# Patient Record
Sex: Female | Born: 1985 | Race: Black or African American | Hispanic: No | Marital: Single | Smoking: Current every day smoker
Health system: Southern US, Community
[De-identification: ages and names within clinical notes are randomized; demographics above are authoritative.]

---

## 2013-09-04 ENCOUNTER — Encounter (HOSPITAL_BASED_OUTPATIENT_CLINIC_OR_DEPARTMENT_OTHER): Payer: Self-pay | Admitting: Emergency Medicine

## 2013-09-04 ENCOUNTER — Emergency Department (HOSPITAL_BASED_OUTPATIENT_CLINIC_OR_DEPARTMENT_OTHER)
Admission: EM | Admit: 2013-09-04 | Discharge: 2013-09-04 | Disposition: A | Discharge status: Home / Self Care | Payer: Self-pay | Attending: Emergency Medicine | Admitting: Emergency Medicine

## 2013-09-04 DIAGNOSIS — Y9389 Activity, other specified: Secondary | ICD-10-CM | POA: Insufficient documentation

## 2013-09-04 DIAGNOSIS — Y929 Unspecified place or not applicable: Secondary | ICD-10-CM | POA: Insufficient documentation

## 2013-09-04 DIAGNOSIS — F17200 Nicotine dependence, unspecified, uncomplicated: Secondary | ICD-10-CM | POA: Insufficient documentation

## 2013-09-04 DIAGNOSIS — S0501XA Injury of conjunctiva and corneal abrasion without foreign body, right eye, initial encounter: Secondary | ICD-10-CM

## 2013-09-04 DIAGNOSIS — S058X9A Other injuries of unspecified eye and orbit, initial encounter: Secondary | ICD-10-CM | POA: Insufficient documentation

## 2013-09-04 DIAGNOSIS — X58XXXA Exposure to other specified factors, initial encounter: Secondary | ICD-10-CM | POA: Insufficient documentation

## 2013-09-04 MED ORDER — CYCLOPENTOLATE HCL 1 % OP SOLN
1.0000 [drp] | Freq: Once | OPHTHALMIC | Status: AC
  Administered 2013-09-04: 1 [drp] via OPHTHALMIC
  Filled 2013-09-04: qty 2

## 2013-09-04 MED ORDER — TETRACAINE HCL 0.5 % OP SOLN: 1.0000 [drp] | Freq: Once | OPHTHALMIC | Status: DC

## 2013-09-04 MED ORDER — HYDROCODONE-ACETAMINOPHEN 5-325 MG PO TABS: 2.0000 | ORAL_TABLET | ORAL | Status: AC | PRN

## 2013-09-04 MED ORDER — TETRACAINE HCL 0.5 % OP SOLN
1.0000 [drp] | Freq: Once | OPHTHALMIC | Status: AC
  Administered 2013-09-04: 1 [drp] via OPHTHALMIC
  Filled 2013-09-04: qty 2

## 2013-09-04 MED ORDER — FLUORESCEIN SODIUM 1 MG OP STRP
1.0000 | ORAL_STRIP | Freq: Once | OPHTHALMIC | Status: AC
  Administered 2013-09-04: 1 via OPHTHALMIC
  Filled 2013-09-04: qty 1

## 2013-09-04 MED ORDER — TOBRAMYCIN 0.3 % OP SOLN: 2.0000 [drp] | OPHTHALMIC | Status: AC

## 2013-09-04 NOTE — ED Notes (Signed)
Pt reports eye pain that started last night after taking out her contacts.  Noted to have a swollen, red (R) eye.  Reports drainage from it.

## 2013-09-04 NOTE — ED Provider Notes (Signed)
CSN: [id number]     Arrival date & time 09/04/13  0907 History   First MD Initiated Contact with Patient 09/04/13 [phone]     Chief Complaint  Patient presents with  . Eye Injury     (Consider location/radiation/quality/duration/timing/severity/associated sxs/prior Treatment) Patient is a 28 y.o. female presenting with eye injury. The history is provided by the patient. No language interpreter was used.  Eye Injury This is a new problem. The current episode started yesterday. The problem occurs constantly. The problem has been gradually worsening. Nothing aggravates the symptoms. She has tried nothing for the symptoms. The treatment provided mild relief.    History reviewed. No pertinent past medical history. History reviewed. No pertinent past surgical history. History reviewed. No pertinent family history. History  Substance Use Topics  . Smoking status: Current Every Day Smoker -- 1.00 packs/day    Types: Cigarettes  . Smokeless tobacco: Not on file  . Alcohol Use: No   OB History   Grav Para Term Preterm Abortions TAB SAB Ect Mult Living                 Review of Systems  Eyes: Positive for redness and visual disturbance.  All other systems reviewed and are negative.      Allergies  Review of patient's allergies indicates no known allergies.  Home Medications  No current outpatient prescriptions on file. BP 143/89  Pulse 99  Temp(Src) 98.2 F (36.8 C) (Oral)  Resp 18  SpO2 98% Physical Exam  Nursing note and vitals reviewed. Constitutional: She appears well-nourished.  HENT:  Head: Normocephalic and atraumatic.  Eyes: EOM are normal. Pupils are equal, round, and reactive to light. Right eye exhibits exudate. Right conjunctiva is injected.  Slit lamp exam:      The right eye shows fluorescein uptake.  wrythema,  Neck: Normal range of motion.  Cardiovascular: Normal rate.   Pulmonary/Chest: Effort normal.    ED Course  Procedures (including critical care  time) Labs Review Labs Reviewed - No data to display Imaging Review No results found.   EKG Interpretation None      MDM   Final diagnoses:  Corneal abrasion, right   No current eye doctor.   Pt advised to return for recheck     Elson AreasLeslie K Sofia, [place] JerseyPA-C 09/04/13 [phone]

## 2013-09-04 NOTE — Discharge Instructions (Signed)
Corneal Abrasion  The cornea is the clear covering at the front and center of the eye. When looking at the colored portion of the eye (iris), you are looking through the cornea. This very thin tissue is made up of many layers. The surface layer is a single layer of cells (corneal epithelium) and is one of the most sensitive tissues in the body. If a scratch or injury causes the corneal epithelium to come off, it is called a corneal abrasion. If the injury extends to the tissues below the epithelium, the condition is called a corneal ulcer.  CAUSES    Scratches.   Trauma.   Foreign body in the eye.  Some people have recurrences of abrasions in the area of the original injury even after it has healed (recurrent erosion syndrome). Recurrent erosion syndrome generally improves and goes away with time.  SYMPTOMS    Eye pain.   Difficulty or inability to keep the injured eye open.   The eye becomes very sensitive to light.   Recurrent erosions tend to happen suddenly, first thing in the morning, usually after waking up and opening the eye.  DIAGNOSIS   Your health care provider can diagnose a corneal abrasion during an eye exam. Dye is usually placed in the eye using a drop or a small paper strip moistened by your tears. When the eye is examined with a special light, the abrasion shows up clearly because of the dye.  TREATMENT    Small abrasions may be treated with antibiotic drops or ointment alone.   Usually a pressure patch is specially applied. Pressure patches prevent the eye from blinking, allowing the corneal epithelium to heal. A pressure patch also reduces the amount of pain present in the eye during healing. Most corneal abrasions heal within 2 3 days with no effect on vision.  If the abrasion becomes infected and spreads to the deeper tissues of the cornea, a corneal ulcer can result. This is serious because it can cause corneal scarring. Corneal scars interfere with light passing through the cornea  and cause a loss of vision in the involved eye.  HOME CARE INSTRUCTIONS   Use medicine or ointment as directed. Only take over-the-counter or prescription medicines for pain, discomfort, or fever as directed by your health care provider.   Do not drive or operate machinery while your eye is patched. Your ability to judge distances is impaired.   If your health care provider has given you a follow-up appointment, it is very important to keep that appointment. Not keeping the appointment could result in a severe eye infection or permanent loss of vision. If there is any problem keeping the appointment, let your health care provider know.  SEEK MEDICAL CARE IF:    You have pain, light sensitivity, and a scratchy feeling in one eye or both eyes.   Your pressure patch keeps loosening up, and you can blink your eye under the patch after treatment.   Any kind of discharge develops from the eye after treatment or if the lids stick together in the morning.   You have the same symptoms in the morning as you did with the original abrasion days, weeks, or months after the abrasion healed.  MAKE SURE YOU:    Understand these instructions.   Will watch your condition.   Will get help right away if you are not doing well or get worse.  Document Released: 06/12/2000 Document Revised: 04/05/2013 Document Reviewed: 02/20/2013  [employer] Patient Information   2014 [employer]

## 2013-09-04 NOTE — ED Provider Notes (Signed)
Medical screening examination/treatment/procedure(s) were performed by non-physician practitioner and as supervising physician I was immediately available for consultation/collaboration.   EKG Interpretation None        Audree CamelScott T Devansh Riese, MD 09/04/13 [phone]

## 2019-10-23 ENCOUNTER — Other Ambulatory Visit: Payer: Self-pay

## 2019-10-23 ENCOUNTER — Encounter (HOSPITAL_BASED_OUTPATIENT_CLINIC_OR_DEPARTMENT_OTHER): Payer: Self-pay | Admitting: *Deleted

## 2019-10-23 ENCOUNTER — Emergency Department (HOSPITAL_BASED_OUTPATIENT_CLINIC_OR_DEPARTMENT_OTHER): Payer: Medicaid Other

## 2019-10-23 ENCOUNTER — Emergency Department (HOSPITAL_BASED_OUTPATIENT_CLINIC_OR_DEPARTMENT_OTHER)
Admission: EM | Admit: 2019-10-23 | Discharge: 2019-10-23 | Disposition: A | Discharge status: Home / Self Care | Payer: Medicaid Other | Attending: Emergency Medicine | Admitting: Emergency Medicine

## 2019-10-23 DIAGNOSIS — F17210 Nicotine dependence, cigarettes, uncomplicated: Secondary | ICD-10-CM | POA: Diagnosis not present

## 2019-10-23 DIAGNOSIS — M545 Low back pain: Secondary | ICD-10-CM | POA: Diagnosis not present

## 2019-10-23 DIAGNOSIS — M25511 Pain in right shoulder: Secondary | ICD-10-CM | POA: Diagnosis present

## 2019-10-23 DIAGNOSIS — M7918 Myalgia, other site: Secondary | ICD-10-CM

## 2019-10-23 LAB — PREGNANCY, URINE: Preg Test, Ur: NEGATIVE

## 2019-10-23 MED ORDER — HYDROCODONE-ACETAMINOPHEN 5-325 MG PO TABS
1.0000 | ORAL_TABLET | Freq: Once | ORAL | Status: AC
  Administered 2019-10-23: 1 via ORAL
  Filled 2019-10-23: qty 1

## 2019-10-23 MED ORDER — NAPROXEN 250 MG PO TABS
500.0000 mg | ORAL_TABLET | Freq: Once | ORAL | Status: AC
  Administered 2019-10-23: 500 mg via ORAL
  Filled 2019-10-23: qty 2

## 2019-10-23 MED ORDER — NAPROXEN 500 MG PO TABS: 500.0000 mg | ORAL_TABLET | Freq: Two times a day (BID) | ORAL | 0 refills | Status: AC

## 2019-10-23 MED ORDER — CYCLOBENZAPRINE HCL 10 MG PO TABS: 10.0000 mg | ORAL_TABLET | Freq: Two times a day (BID) | ORAL | 0 refills | Status: AC | PRN

## 2019-10-23 NOTE — Discharge Instructions (Signed)
You were seen today for shoulder and back pain.  Your x-rays are negative for fracture.  This is likely muscle pain related to your injury.  You will likely be very sore in the next 2 to 3 days.  Take medications as prescribed.
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
## 2021-11-13 IMAGING — DX DG LUMBAR SPINE COMPLETE 4+V
5 series · 5 of 5 positions shown · non-contrast
Comparison: None.

CLINICAL DATA: Pain and trauma

EXAM:
LUMBAR SPINE - COMPLETE 4+ VIEW

[l-spine ap]
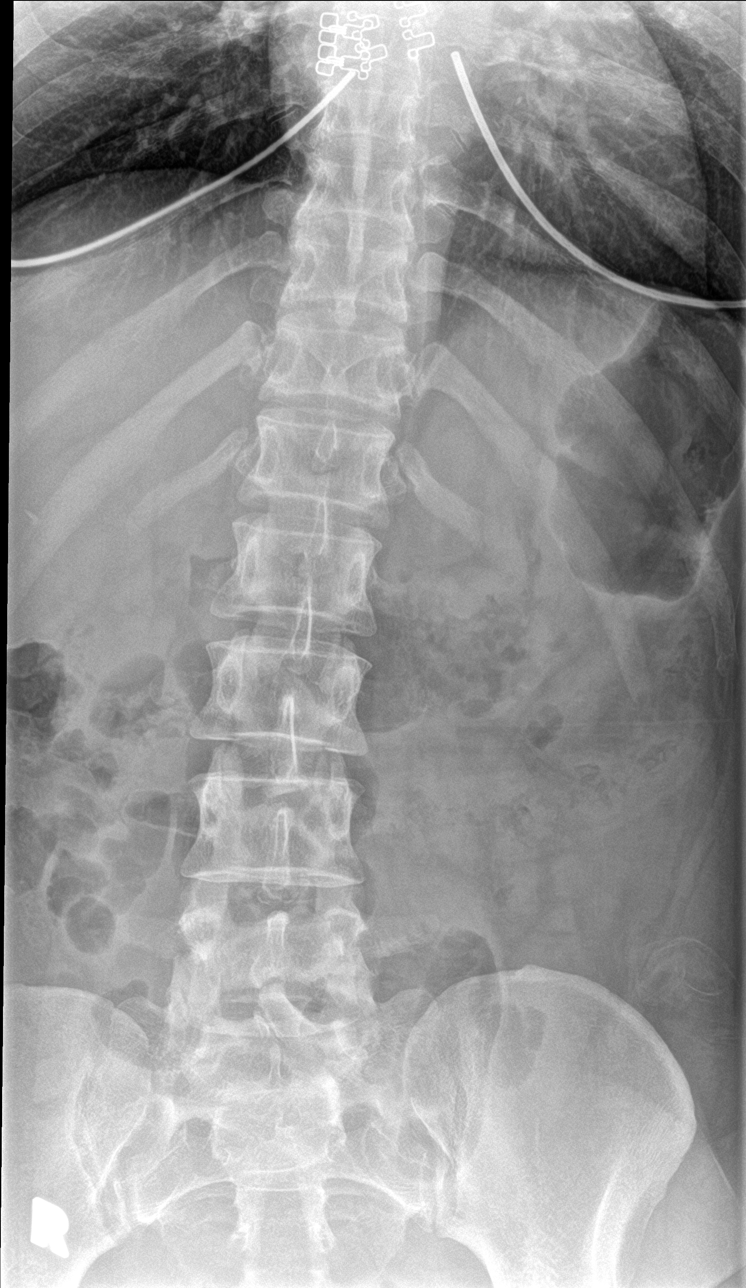

[l-spine obl (1 of 2)]
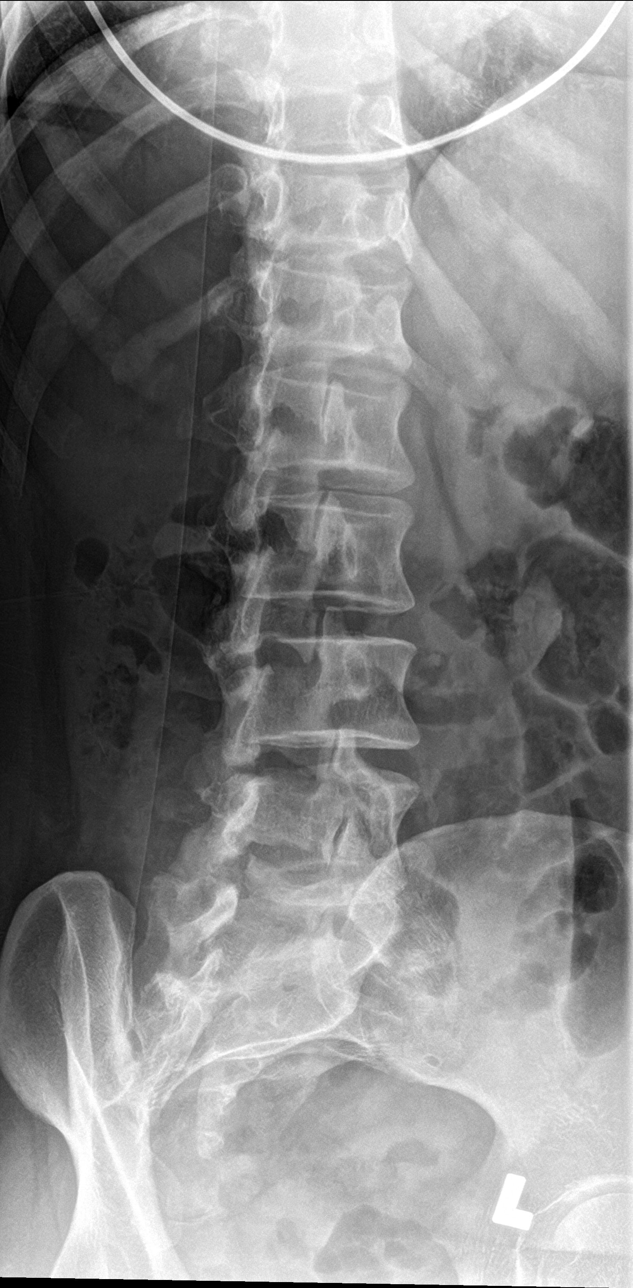

[l-spine obl (2 of 2)]
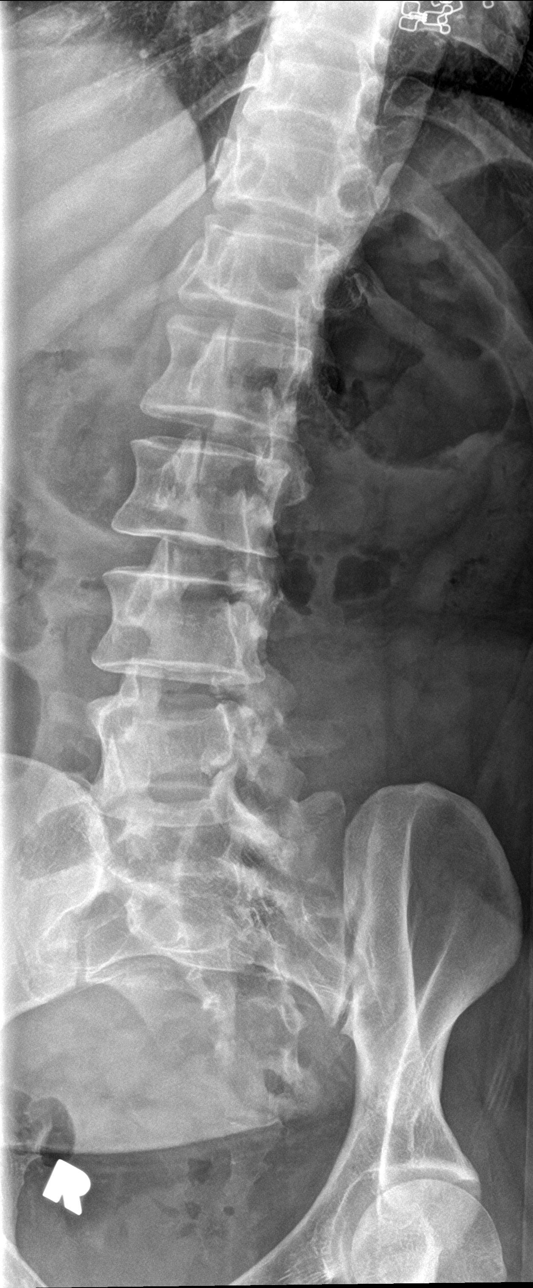

[l-spine lat]
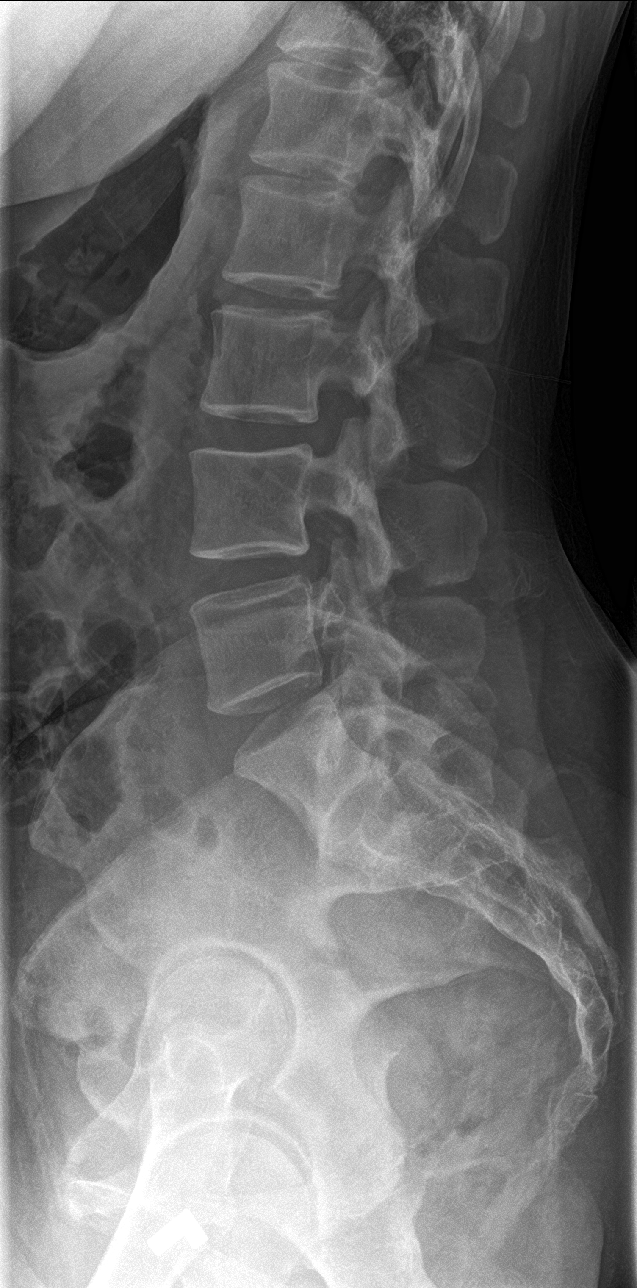

[l-spine spot]
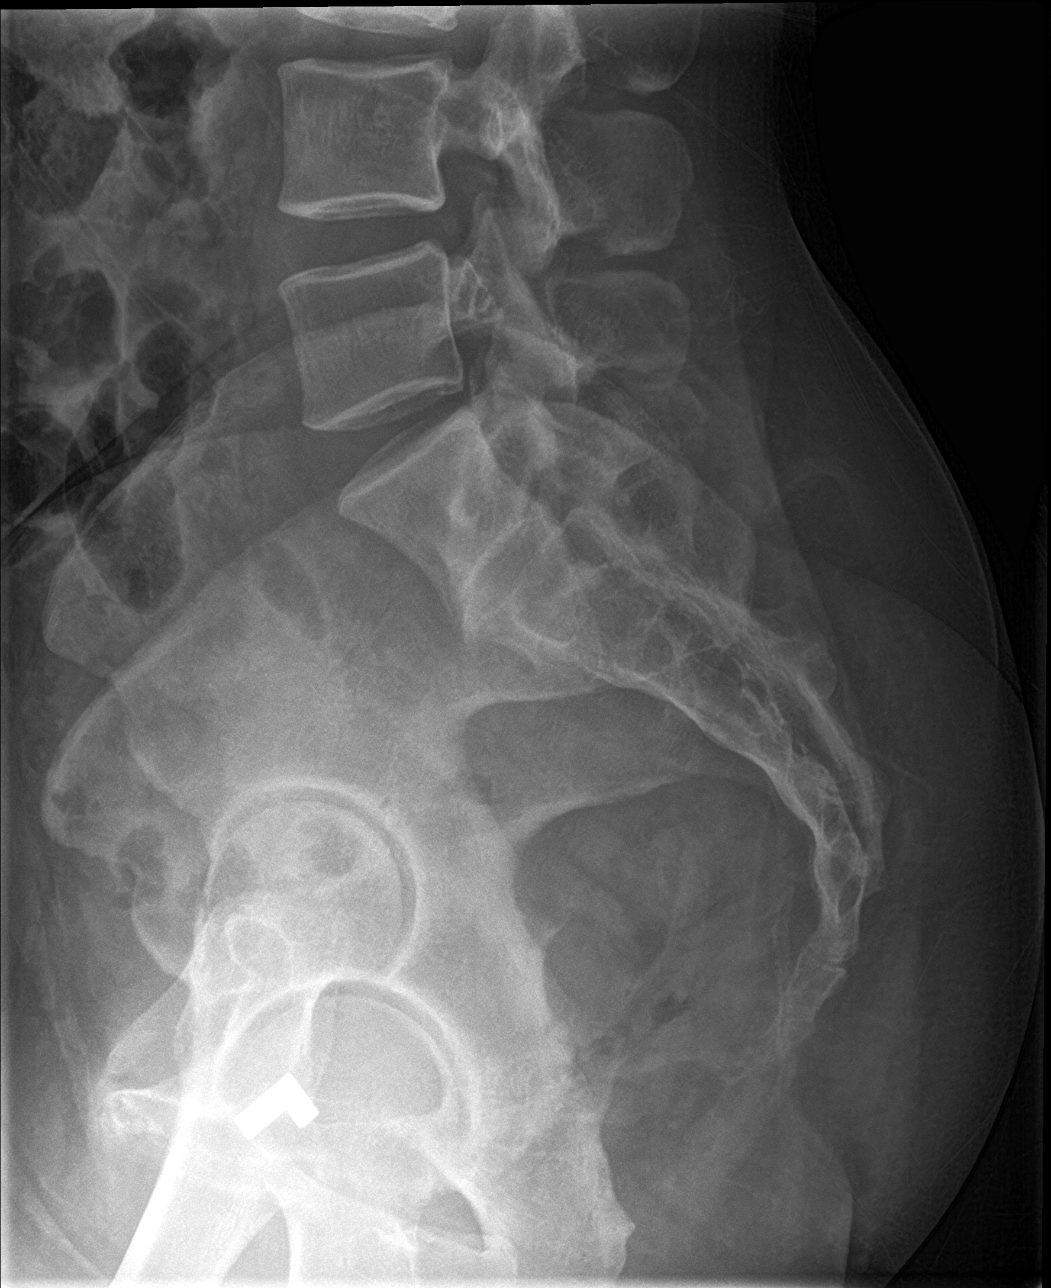

[5 of 5 positions shown; findings below may reference images not displayed]

FINDINGS: There is no evidence of lumbar spine fracture. Alignment is normal.
Intervertebral disc spaces are maintained.
IMPRESSION: Negative.
